# Patient Record
Sex: Female | Born: 1965 | Race: White | Hispanic: No | Marital: Married | Smoking: Former smoker
Health system: Southern US, Community
[De-identification: ages and names within clinical notes are randomized; demographics above are authoritative.]

## PROBLEM LIST (undated history)

## (undated) DIAGNOSIS — I10 Essential (primary) hypertension: Secondary | ICD-10-CM

## (undated) DIAGNOSIS — F32A Depression, unspecified: Secondary | ICD-10-CM

## (undated) DIAGNOSIS — E7800 Pure hypercholesterolemia, unspecified: Secondary | ICD-10-CM

## (undated) HISTORY — DX: Pure hypercholesterolemia, unspecified: E78.00

## (undated) HISTORY — DX: Essential (primary) hypertension: I10

## (undated) HISTORY — DX: Depression, unspecified: F32.A

---

## 2001-09-27 ENCOUNTER — Ambulatory Visit (HOSPITAL_COMMUNITY): Admission: RE | Admit: 2001-09-27 | Discharge: 2001-09-27 | Payer: Self-pay | Admitting: Obstetrics & Gynecology

## 2002-05-17 ENCOUNTER — Observation Stay (HOSPITAL_COMMUNITY): Admission: AD | Admit: 2002-05-17 | Discharge: 2002-05-19 | Payer: Self-pay | Admitting: Obstetrics & Gynecology

## 2002-05-18 ENCOUNTER — Encounter: Payer: Self-pay | Admitting: Obstetrics and Gynecology

## 2002-05-18 ENCOUNTER — Encounter: Payer: Self-pay | Admitting: Obstetrics & Gynecology

## 2002-07-25 ENCOUNTER — Inpatient Hospital Stay (HOSPITAL_COMMUNITY): Admission: RE | Admit: 2002-07-25 | Discharge: 2002-07-27 | Payer: Self-pay | Admitting: Obstetrics & Gynecology

## 2002-10-04 ENCOUNTER — Ambulatory Visit (HOSPITAL_COMMUNITY): Admission: RE | Admit: 2002-10-04 | Discharge: 2002-10-04 | Payer: Self-pay | Admitting: Obstetrics & Gynecology

## 2002-10-04 ENCOUNTER — Encounter: Payer: Self-pay | Admitting: Obstetrics & Gynecology

## 2004-09-19 ENCOUNTER — Ambulatory Visit (HOSPITAL_COMMUNITY): Admission: RE | Admit: 2004-09-19 | Discharge: 2004-09-19 | Payer: Self-pay | Admitting: Obstetrics & Gynecology

## 2007-01-04 ENCOUNTER — Encounter: Admission: RE | Admit: 2007-01-04 | Discharge: 2007-01-04 | Payer: Self-pay | Admitting: Obstetrics & Gynecology

## 2010-02-02 ENCOUNTER — Encounter: Payer: Self-pay | Admitting: Obstetrics & Gynecology

## 2010-05-30 NOTE — H&P (Signed)
   NAMEMARYLYNNE, Frances Robinson                            ACCOUNT NO.:  [id number]   MEDICAL RECORD NO.:  [id number]                   PATIENT TYPE:  OBV   LOCATION:  A426                                 FACILITY:  [facility]   PHYSICIAN:  Duane Lope, M.D.                   DATE OF BIRTH:  Oct 30, 1965   DATE OF ADMISSION:  05/17/2002  DATE OF DISCHARGE:                                HISTORY & PHYSICAL   REASON FOR ADMISSION:  Pregnancy at 29 weeks and 1 day with possible  preeclampsia.   HISTORY OF PRESENT ILLNESS:  Frances Robinson presented today for her normal OB  checkup.  At that time Frances Robinson had elevation in blood pressure and proteinuria  and 3+ DTRs.   PAST MEDICAL HISTORY:  Negative.   PAST SURGICAL HISTORY:  1. Positive for a D&C in 1980.  2. Laparoscopic in 2003.  3. Foot surgery in 1990.   ALLERGIES:  No known drug allergies.   SOCIAL HISTORY:  Frances Robinson is married.  Frances Robinson and her husband have a stable  relationship.  He is supportive.   FAMILY HISTORY:  Positive for hypertension, coronary artery disease, and  cancer.   PRENATAL COURSE:  Essentially benign and uneventful up until this point.  Blood type is A positive.  UDS negative.  Rubella is immune.  Hepatitis B  surface antigen negative.  HIV negative.  Serology is nonreactive.  GC and  Chlamydia negative.  Frances Robinson declined her MSAFP.   PHYSICAL EXAMINATION:  VITAL SIGNS:  Weight 238-1/2.  Blood pressure 140/90.  GENERAL:  Frances Robinson has 1+ glucose, 2+ protein, positive for blood __________.  Frances Robinson does have a trace edema.  Frances Robinson also has some facial edema.  Good fetal  movement.  Fundal height is 32 cm.  Fetal heart rate 140, strong and  regular.  DTRs are 3+.   PLAN:  We are going to admit, get her preeclamptic labs, do a 24-hour urine  and monitor her vital signs and fetal status.     Zerita Boers, N.M.                      Duane Lope, M.D.   DL/MEDQ  D:  40/98/1191  T:  05/17/2002  Job:  [id number]   cc:   [facility]

## 2010-05-30 NOTE — Discharge Summary (Signed)
   NAMEJEENA, Frances Robinson                            ACCOUNT NO.:  [id number]   MEDICAL RECORD NO.:  [id number]                   PATIENT TYPE:  INP   LOCATION:  A420                                 FACILITY:  [facility]   PHYSICIAN:  Lazaro Arms, M.D.                DATE OF BIRTH:  Jun 07, 1965   DATE OF ADMISSION:  07/25/2002  DATE OF DISCHARGE:  07/27/2002                                 DISCHARGE SUMMARY   DISCHARGE DIAGNOSES:  1. Status post primary low transverse cesarean section.  2. Unremarkable postoperative course.   PROCEDURE:  Primary low transverse cesarean section.   HISTORY OF PRESENT ILLNESS:  Please refer to transcribed history and  physical and operative note for details of admission to the hospital.   HOSPITAL COURSE:  The patient had an unremarkable postoperative course.  She  tolerated clear liquids and a regular diet well without symptoms, was  ambulatory.  Had a normal hemoglobin and hematocrit postoperatively with an  appropriate drop from 11.6 to 10.5.  Her incision was clean, dry, and  intact, and she tolerated oral pain medicine.  She was discharged to home on  postoperative day #2 in good and stable.  Follow up in the office next week  to have her staples removed.  She was given instructions and precautions for  return prior to that time.                                               Lazaro Arms, M.D.    [name]  D:  08/08/2002  T:  08/08/2002  Job:  [id number]

## 2010-05-30 NOTE — Op Note (Signed)
[name], Frances Robinson                            ACCOUNT NO.:  [id number]   MEDICAL RECORD NO.:  [id number]                   PATIENT TYPE:  AMB   LOCATION:  DAY                                  FACILITY:  [facility]   PHYSICIAN:  Lazaro Arms, M.D.                DATE OF BIRTH:  1965-02-21   DATE OF PROCEDURE:  09/27/2001  DATE OF DISCHARGE:                                 OPERATIVE REPORT   PREOPERATIVE DIAGNOSES:  1. Dysmenorrhea.  2. Dyspareunia.   POSTOPERATIVE DIAGNOSES:  1. Dysmenorrhea.  2. Dyspareunia.   PROCEDURE:  Diagnostic laparoscopy with chromotubation.   SURGEON:  Lazaro Arms, M.D.   ANESTHESIA:  General endotracheal.   FINDINGS:  The patient had two small pinhead dots of endometriosis in the  posterior cul-de-sac.  These were both ablated without difficulty.  The  ovaries, tubes, uterus, peritoneal surfaces, uterosacral ligaments all  appeared to be normal.  There was no other endometriosis seen, no adhesive  disease.  The tubal fimbria were supple and normal.  A 1:10 dilution of  methylene blue was injected and both tubes filled and spilled.  The right  tube was a little sluggish but I finally kinked off the left enough to allow  it to spill as well.   DESCRIPTION OF OPERATION:  The patient was taken to the operating room,  placed in the supine possible, underwent general endotracheal anesthesia,  placed in the dorsal lithotomy position.  The vagina was prepped.  The Foley  catheter was placed.  The abdomen was prepped and draped.  A Pelosi uterine  manipulator was placed into the uterine cavity and used as well for  chromotubation.  An incision was made in the umbilicus and carried down to  the rectus fascia digitally.  The pistol-grip trocar was then placed into  the peritoneal cavity.  I was actually between the fascia and peritoneum and  entered the peritoneum manually.  I then insufflated the peritoneal cavity  and this was confirmed with the video  laparoscope and done under direct and  continuous visualization through the fascia.  Two fingerbreadths above the  pubis a 5-mm trocar was then placed under direct visualization.  The above-  noted findings were seen.  A total of 60 cc of 10% methylene blue solution  was injected and both tubes filled and spilled easily.  The endometriosis  was burned with the cautery.  The methylene blue was then suctioned out and  diluted further with some saline.  There was no bleeding surfaces.  The  trocars were removed, the gas was allowed to escape.  The fascial defect of  the umbilical incision was closed with a single 0 Vicryl suture.  Both  incisions were closed with 3-0  Vicryl subcuticular and Dermabond was placed for dressing and wound  protection.  The patient tolerated the procedure well.  She was awakened  from anesthesia, taken to the recovery room in good stable condition.  All  counts correct.                                               Lazaro Arms, M.D.    [name]  D:  09/27/2001  T:  09/28/2001  Job:  [id number]

## 2010-05-30 NOTE — Op Note (Signed)
Frances Robinson, Frances Robinson                            ACCOUNT NO.:  [id number]   MEDICAL RECORD NO.:  [id number]                   PATIENT TYPE:  INP   LOCATION:  A420                                 FACILITY:  [facility]   PHYSICIAN:  Lazaro Arms, M.D.                DATE OF BIRTH:  1965-06-22   DATE OF PROCEDURE:  07/25/2002  DATE OF DISCHARGE:                                 OPERATIVE REPORT   PREOPERATIVE DIAGNOSES:  1. Intrauterine pregnancy [redacted] weeks gestation.  2. Inadequate pelvis.  3. Scheduled for C-section July 28, 2002.  4. Spontaneous rupture of membranes.  5. Desires sterilization.   POSTOPERATIVE DIAGNOSES:  1. Intrauterine pregnancy [redacted] weeks gestation.  2. Inadequate pelvis.  3. Scheduled for C-section July 28, 2002.  4. Spontaneous rupture of membranes.  5. Desires sterilization.   PROCEDURE:  1. Primary low transverse cesarean section with bilateral tubal ligation.   SURGEON:  Lazaro Arms, M.D.   ANESTHESIA:  Spinal.   FINDINGS:  Over a low transverse hysterotomy incision delivered a viable  female infant with Apgars of 9 and 9 with weight to be determined in the  nursery.  There was a  3-vessel cord.  The placenta was normal.  The uterus  tubes and ovaries are normal.   DESCRIPTION OF PROCEDURE:  The patient was taken to the operating room and  underwent a spinal anesthetic.  She was placed in the supine position with a  roll under her right hip.  She was prepped and draped in the usual sterile  fashion.  A Foley catheter was placed.  A Pfannenstiel skin incision was  made and carried down sharply through the rectus fascia which was scored in  the midline and extended laterally.  The fascia was taken off the muscles  superiorly and inferiorly without difficulty.  The muscles were divided.  The peritoneal cavity was entered.  Bladder blade was placed.   A vesicouterine serosal flap was created.  A low transverse hysterotomy  incision made and over this  incision was delivered a viable female infant.  Apgars were 9 and 9 with weight to be determined in the nursery.  The infant  underwent routine neonatal resuscitation.  There was a 3 vessel cord.  Cord  blood and cord gas were sent.  The placenta was delivered spontaneously.  The uterus was exteriorized and closed in two layers.  The first being a  running interlocking layer and the second being an imbricating layer.   Modified Pomeroy bilateral tubal ligation was performed bilaterally and  tubal segments send to the lab.  There was good hemostasis.   The uterus was hemostatic.  The uterus was replaced in the peritoneal  cavity.  The pericolic gutters were irrigated.  All pedicles were  hemostatic.  The peritoneum and muscles reapproximated loosely.  The fascia  was closed using #0 Vicryl running.  Subcutaneous tissues made hemostatic  and irrigated.  The skin was closed using skin staples.  The patient  tolerated the procedure well.  She experienced 500 cc of blood loss and was  taken to the recovery room in good stable condition.  All counts were  correct x3.                                               Lazaro Arms, M.D.    [name]  D:  07/25/2002  T:  07/25/2002  Job:  [id number]

## 2010-05-30 NOTE — H&P (Signed)
Frances Robinson, Frances Robinson                            ACCOUNT NO.:  [id number]   MEDICAL RECORD NO.:  [id number]                   PATIENT TYPE:  INP   LOCATION:  A420                                 FACILITY:  [facility]   PHYSICIAN:  Lazaro Arms, M.D.                DATE OF BIRTH:  12-24-1965   DATE OF ADMISSION:  07/25/2002  DATE OF DISCHARGE:                                HISTORY & PHYSICAL   HISTORY OF PRESENT ILLNESS:  Frances Robinson is a 45 year old white female, gravida  2, para 0, abortus 1, estimated date of delivery of August 01, 2002, currently  at 82 weeks' gestation, who presented to labor and delivery complaining of  spontaneous rupture of membranes.  It was obvious spontaneous rupture of  membranes by the nursing evaluation.  She was scheduled for a C-section on  July 16 because of a very narrow pubic arch and a high presenting part.  She  also desires sterilization.  As a result, we will proceed with a cesarean  section today.   PAST MEDICAL HISTORY:  Negative.   PAST SURGICAL HISTORY:  She had a laparoscopy in October 2003 and promptly  got pregnant that next month.  In 1990 she had foot surgery.   PAST OBSTETRICAL HISTORY:  She had an elective termination in 1980.   ALLERGIES:  None.   MEDICATIONS:  Prenatal vitamins.   SOCIAL HISTORY:  She is married and works full-time.   FAMILY HISTORY:  Significant for hypertension and cardiovascular disease.   PRENATAL LABORATORY DATA:  A positive.  Rubella is immune.  Hepatitis B was  negative.  HIV was nonreactive.  Serology was nonreactive.  Pap was normal.  GC and Chlamydia were negative.  She declined her AFP.  Her group B strep  was negative.  Her Glucola was 138.   PHYSICAL EXAMINATION:  HEENT:  Unremarkable.  NECK:  Thyroid is normal.  CHEST:  Lungs clear.  CARDIAC:  Regular rate and rhythm without murmur, rub, or gallop.  BREASTS:  Without masses, discharge, or skin changes.  ABDOMEN:  Gravid, fundal height of 40 cm.  PELVIC:  Long, thick, and closed.  EXTREMITIES:  Warm with 1+ edema.  NEUROLOGIC:  Grossly intact.   IMPRESSION:  1. Intrauterine pregnancy at 62 weeks' gestation.  2. Inadequate pelvis.  3. Spontaneous rupture of membranes.   PLAN:  The patient is admitted for a cesarean section and tubal ligation.  She does desire permanent sterilization.  We will proceed with that this  morning.                                               Lazaro Arms, M.D.    Frances Robinson  D:  07/25/2002  T:  07/25/2002  Job:  [id number]  

## 2013-01-20 ENCOUNTER — Other Ambulatory Visit: Payer: Self-pay | Admitting: Obstetrics & Gynecology

## 2013-01-20 DIAGNOSIS — Z139 Encounter for screening, unspecified: Secondary | ICD-10-CM

## 2013-01-30 ENCOUNTER — Ambulatory Visit (HOSPITAL_COMMUNITY)
Admission: RE | Admit: 2013-01-30 | Discharge: 2013-01-30 | Disposition: A | Discharge status: Home / Self Care | Payer: BC Managed Care – PPO | Source: Ambulatory Visit | Attending: Obstetrics & Gynecology | Admitting: Obstetrics & Gynecology

## 2013-01-30 DIAGNOSIS — Z1231 Encounter for screening mammogram for malignant neoplasm of breast: Secondary | ICD-10-CM | POA: Insufficient documentation

## 2013-01-30 DIAGNOSIS — Z139 Encounter for screening, unspecified: Secondary | ICD-10-CM

## 2013-02-01 ENCOUNTER — Other Ambulatory Visit: Payer: Self-pay | Admitting: Women's Health

## 2015-02-05 ENCOUNTER — Other Ambulatory Visit (HOSPITAL_COMMUNITY)
Admission: RE | Admit: 2015-02-05 | Discharge: 2015-02-05 | Disposition: A | Discharge status: Home / Self Care | Payer: BLUE CROSS/BLUE SHIELD | Source: Ambulatory Visit | Attending: Obstetrics & Gynecology | Admitting: Obstetrics & Gynecology

## 2015-02-05 ENCOUNTER — Ambulatory Visit (INDEPENDENT_AMBULATORY_CARE_PROVIDER_SITE_OTHER): Payer: BLUE CROSS/BLUE SHIELD | Admitting: Obstetrics & Gynecology

## 2015-02-05 ENCOUNTER — Encounter: Payer: Self-pay | Admitting: Obstetrics & Gynecology

## 2015-02-05 VITALS — BP 128/70 | HR 80 | Ht 66.0 in | Wt 249.4 lb

## 2015-02-05 DIAGNOSIS — D500 Iron deficiency anemia secondary to blood loss (chronic): Secondary | ICD-10-CM | POA: Diagnosis not present

## 2015-02-05 DIAGNOSIS — Z01419 Encounter for gynecological examination (general) (routine) without abnormal findings: Secondary | ICD-10-CM

## 2015-02-05 DIAGNOSIS — Z1151 Encounter for screening for human papillomavirus (HPV): Secondary | ICD-10-CM | POA: Insufficient documentation

## 2015-02-05 DIAGNOSIS — N921 Excessive and frequent menstruation with irregular cycle: Secondary | ICD-10-CM

## 2015-02-05 LAB — POCT HEMOGLOBIN: HEMOGLOBIN: 8.2 g/dL — AB (ref 12.2–16.2)

## 2015-02-05 MED ORDER — MEGESTROL ACETATE 40 MG PO TABS: ORAL_TABLET | ORAL | Status: DC

## 2015-02-05 NOTE — Progress Notes (Signed)
Patient ID: Frances Robinson, female   DOB: 01/06/1966, 50 y.o.   MRN: [id number] Subjective:     Frances Robinson is a 50 y.o. female here for a routine exam.  Patient's last menstrual period was 01/15/2014. No obstetric history on file. Birth Control Method:  BTL Menstrual Calendar(currently): heavy painful lasting longer  Current complaints: menses.   Current acute medical issues:     Recent Gynecologic History Patient's last menstrual period was 01/15/2014. Last Pap: 2004?,  normal Last mammogram: 2015,  normal  History reviewed. No pertinent past medical history.  History reviewed. No pertinent past surgical history.  OB History    No data available      Social History   Social History  . Marital Status: Married    Spouse Name: N/A  . Number of Children: N/A  . Years of Education: N/A   Social History Main Topics  . Smoking status: Former Research scientist (life sciences)  . Smokeless tobacco: None  . Alcohol Use: None  . Drug Use: None  . Sexual Activity: Not Asked   Other Topics Concern  . None   Social History Narrative  . None    Family History  Problem Relation Age of Onset  . Diabetes Mother   . Diabetes Father      Current outpatient prescriptions:  .  citalopram (CELEXA) 20 MG tablet, Take 20 mg by mouth daily., Disp: , Rfl:  .  famotidine (PEPCID) 20 MG tablet, Take 20 mg by mouth 2 (two) times daily., Disp: , Rfl:  .  lisinopril (PRINIVIL,ZESTRIL) 5 MG tablet, Take 5 mg by mouth daily., Disp: , Rfl:  .  Melaton-Thean-Cham-PassF-LBalm (MELATONIN + L-THEANINE PO), Take by mouth., Disp: , Rfl:  .  Propylhexedrine (BENZEDREX NA), Place into the nose., Disp: , Rfl:  .  megestrol (MEGACE) 40 MG tablet, 3 tablets a day for 5 days, 2 tablets a day for 5 days then 1 tablet daily, Disp: 45 tablet, Rfl: 3  Review of Systems  Review of Systems  Constitutional: Negative for fever, chills, weight loss, malaise/fatigue and diaphoresis.  HENT: Negative for hearing loss, ear pain,  nosebleeds, congestion, sore throat, neck pain, tinnitus and ear discharge.   Eyes: Negative for blurred vision, double vision, photophobia, pain, discharge and redness.  Respiratory: Negative for cough, hemoptysis, sputum production, shortness of breath, wheezing and stridor.   Cardiovascular: Negative for chest pain, palpitations, orthopnea, claudication, leg swelling and PND.  Gastrointestinal: negative for abdominal pain. Negative for heartburn, nausea, vomiting, diarrhea, constipation, blood in stool and melena.  Genitourinary: Negative for dysuria, urgency, frequency, hematuria and flank pain.  Musculoskeletal: Negative for myalgias, back pain, joint pain and falls.  Skin: Negative for itching and rash.  Neurological: Negative for dizziness, tingling, tremors, sensory change, speech change, focal weakness, seizures, loss of consciousness, weakness and headaches.  Endo/Heme/Allergies: Negative for environmental allergies and polydipsia. Does not bruise/bleed easily.  Psychiatric/Behavioral: Negative for depression, suicidal ideas, hallucinations, memory loss and substance abuse. The patient is not nervous/anxious and does not have insomnia.        Objective:  Blood pressure 128/70, pulse 80, height 5\' 6"  (1.676 m), weight 249 lb 6.4 oz (113.127 kg), last menstrual period 01/15/2014.   Physical Exam  Vitals reviewed. Constitutional: She is oriented to person, place, and time. She appears well-developed and well-nourished.  HENT:  Head: Normocephalic and atraumatic.        Right Ear: External ear normal.  Left Ear: External ear normal.  Nose: Nose normal.  Mouth/Throat: Oropharynx is clear and moist.  Eyes: Conjunctivae and EOM are normal. Pupils are equal, round, and reactive to light. Right eye exhibits no discharge. Left eye exhibits no discharge. No scleral icterus.  Neck: Normal range of motion. Neck supple. No tracheal deviation present. No thyromegaly present.  Cardiovascular:  Normal rate, regular rhythm, normal heart sounds and intact distal pulses.  Exam reveals no gallop and no friction rub.   No murmur heard. Respiratory: Effort normal and breath sounds normal. No respiratory distress. She has no wheezes. She has no rales. She exhibits no tenderness.  GI: Soft. Bowel sounds are normal. She exhibits no distension and no mass. There is no tenderness. There is no rebound and no guarding.  Genitourinary:  Breasts no masses skin changes or nipple changes bilaterally      Vulva is normal without lesions Vagina is pink moist without discharge Cervix normal in appearance and pap is done Uterus is normal size shape and contour Adnexa is negative with normal sized ovaries   Musculoskeletal: Normal range of motion. She exhibits no edema and no tenderness.  Neurological: She is alert and oriented to person, place, and time. She has normal reflexes. She displays normal reflexes. No cranial nerve deficit. She exhibits normal muscle tone. Coordination normal.  Skin: Skin is warm and dry. No rash noted. No erythema. No pallor.  Psychiatric: She has a normal mood and affect. Her behavior is normal. Judgment and thought content normal.       Assessment:    Healthy female exam.    Plan:     Return in about 1 month (around 03/08/2015) for GYN sono, Follow up, with Dr Elonda Husky.   Meds ordered this encounter  Medications  . citalopram (CELEXA) 20 MG tablet    Sig: Take 20 mg by mouth daily.  Marland Kitchen lisinopril (PRINIVIL,ZESTRIL) 5 MG tablet    Sig: Take 5 mg by mouth daily.  . famotidine (PEPCID) 20 MG tablet    Sig: Take 20 mg by mouth 2 (two) times daily.  . Melaton-Thean-Cham-PassF-LBalm (MELATONIN + L-THEANINE PO)    Sig: Take by mouth.  . Propylhexedrine (BENZEDREX NA)    Sig: Place into the nose.  . megestrol (MEGACE) 40 MG tablet    Sig: 3 tablets a day for 5 days, 2 tablets a day for 5 days then 1 tablet daily    Dispense:  45 tablet    Refill:  3   Begin megestrol  for cycle management

## 2015-02-08 LAB — CYTOLOGY - PAP

## 2015-03-06 ENCOUNTER — Ambulatory Visit: Payer: BLUE CROSS/BLUE SHIELD | Admitting: Obstetrics & Gynecology

## 2015-03-07 ENCOUNTER — Ambulatory Visit: Payer: BLUE CROSS/BLUE SHIELD | Admitting: Obstetrics & Gynecology

## 2015-03-12 ENCOUNTER — Other Ambulatory Visit (INDEPENDENT_AMBULATORY_CARE_PROVIDER_SITE_OTHER): Payer: BLUE CROSS/BLUE SHIELD

## 2015-03-12 ENCOUNTER — Encounter: Payer: Self-pay | Admitting: Obstetrics & Gynecology

## 2015-03-12 ENCOUNTER — Ambulatory Visit (INDEPENDENT_AMBULATORY_CARE_PROVIDER_SITE_OTHER): Payer: BLUE CROSS/BLUE SHIELD | Admitting: Obstetrics & Gynecology

## 2015-03-12 VITALS — BP 110/70 | HR 76 | Wt 242.0 lb

## 2015-03-12 DIAGNOSIS — D259 Leiomyoma of uterus, unspecified: Secondary | ICD-10-CM

## 2015-03-12 DIAGNOSIS — N83202 Unspecified ovarian cyst, left side: Secondary | ICD-10-CM | POA: Diagnosis not present

## 2015-03-12 DIAGNOSIS — N946 Dysmenorrhea, unspecified: Secondary | ICD-10-CM | POA: Diagnosis not present

## 2015-03-12 DIAGNOSIS — D500 Iron deficiency anemia secondary to blood loss (chronic): Secondary | ICD-10-CM | POA: Diagnosis not present

## 2015-03-12 DIAGNOSIS — N921 Excessive and frequent menstruation with irregular cycle: Secondary | ICD-10-CM

## 2015-03-12 DIAGNOSIS — N83201 Unspecified ovarian cyst, right side: Secondary | ICD-10-CM | POA: Diagnosis not present

## 2015-03-12 MED ORDER — KETOROLAC TROMETHAMINE 10 MG PO TABS: 10.0000 mg | ORAL_TABLET | Freq: Three times a day (TID) | ORAL | Status: DC | PRN

## 2015-03-12 MED ORDER — NORETHINDRONE 0.35 MG PO TABS: 1.0000 | ORAL_TABLET | Freq: Every day | ORAL | Status: DC

## 2015-03-12 MED ORDER — HYDROCODONE-ACETAMINOPHEN 5-325 MG PO TABS: 1.0000 | ORAL_TABLET | Freq: Four times a day (QID) | ORAL | Status: DC | PRN

## 2015-03-12 NOTE — Progress Notes (Signed)
Patient ID: Frances Robinson, female   DOB: 04-16-1965, 50 y.o.   MRN: [id number] Follow up appointment for results  Chief Complaint  Patient presents with  . gyn visit    cramping/ bleeding/ ultrasound today    Blood pressure 110/70, pulse 76, weight 242 lb (109.77 kg).  US Transvaginal Non-ob  03/12/2015  GYNECOLOGIC SONOGRAM MARKAY DORSCHNER is a 50 y.o. LMP 04/04/2015 for a pelvic sonogram for abnormal uterine bleeding. Uterus                      13.2 x 9.24 x 8.9 cm, anteverted uterus w/ a posterior 7 x 5.4 x 6.4 cm subserosal fibroid Endometrium          14.3 symmetrical, wnl Right ovary             3.9 x 3.1 x 2.5 cm, normal rt ov w/ a simple dominate follicle 2.2 x 1.7 x [id number] Left ovary                4.6 x 3.2 x 5.2 cm, ,simple cyst lt ov 3.8 x 3.7 x 3.1cm No free fluid seen Technician Comments: PELVIC US TA/TV: anteverted uterus w/ a posterior 7 x 5.4 x 6.4 cm subserosal fibroid,EEC 14.3cm,simple cyst lt ov 3.8 x 3.7 x 3.1cm,normal rt ov w/ a simple dominate follicle 2.2 x 1.7 x [id number] free fluid seen,lt adnexal pain during ultrasound. Silver Huguenin 03/12/2015 4:18 PM   US Pelvis Complete  03/12/2015  GYNECOLOGIC SONOGRAM Frances Robinson is a 50 y.o. LMP 04/04/2015 for a pelvic sonogram for abnormal uterine bleeding. Uterus                      13.2 x 9.24 x 8.9 cm, anteverted uterus w/ a posterior 7 x 5.4 x 6.4 cm subserosal fibroid Endometrium          14.3 symmetrical, wnl Right ovary             3.9 x 3.1 x 2.5 cm, normal rt ov w/ a simple dominate follicle 2.2 x 1.7 x [id number] Left ovary                4.6 x 3.2 x 5.2 cm, ,simple cyst lt ov 3.8 x 3.7 x 3.1cm No free fluid seen Technician Comments: PELVIC US TA/TV: anteverted uterus w/ a posterior 7 x 5.4 x 6.4 cm subserosal fibroid,EEC 14.3cm,simple cyst lt ov 3.8 x 3.7 x 3.1cm,normal rt ov w/ a simple dominate follicle 2.2 x 1.7 x [id number] free fluid seen,lt adnexal pain during ultrasound. Frances Robinson 03/12/2015 4:18 PM    Normal sonogram  MEDS  ordered this encounter: Meds ordered this encounter  Medications  . ketorolac (TORADOL) 10 MG tablet    Sig: Take 1 tablet (10 mg total) by mouth every 8 (eight) hours as needed.    Dispense:  15 tablet    Refill:  0  . HYDROcodone-acetaminophen (NORCO/VICODIN) 5-325 MG tablet    Sig: Take 1 tablet by mouth every 6 (six) hours as needed.    Dispense:  30 tablet    Refill:  0  . norethindrone (MICRONOR,CAMILA,ERRIN) 0.35 MG tablet    Sig: Take 1 tablet (0.35 mg total) by mouth daily. Take 1 a day    Dispense:  1 Package    Refill:  11    Orders for this encounter: No orders of the defined types were placed in this encounter.  Plan: Will place on megace for 1 month then switch over to micronor in hopes of making it through menopausae without intervention surgically Follow Up: Return in about 6 months (around 09/09/2015) for Follow up, with Dr Elonda Husky.      Face to face time:  10 minutes  Greater than 50% of the visit time was spent in counseling and coordination of care with the patient.  The summary and outline of the counseling and care coordination is summarized in the note above.   All questions were answered.  History reviewed. No pertinent past medical history.  History reviewed. No pertinent past surgical history.  OB History    No data available      Not on File  Social History   Social History  . Marital Status: Married    Spouse Name: N/A  . Number of Children: N/A  . Years of Education: N/A   Social History Main Topics  . Smoking status: Former Research scientist (life sciences)  . Smokeless tobacco: None  . Alcohol Use: None  . Drug Use: None  . Sexual Activity: Not Asked   Other Topics Concern  . None   Social History Narrative    Family History  Problem Relation Age of Onset  . Diabetes Mother   . Diabetes Father

## 2015-03-12 NOTE — Progress Notes (Signed)
PELVIC US TA/TV: anteverted uterus w/ a posterior 7 x 5.4 x 6.4 cm subserosal fibroid,EEC 14.3cm,simple cyst lt ov 3.8 x 3.7 x 3.1cm,normal rt ov w/ a simple dominate follicle 2.2 x 1.7 x [id number] free fluid seen,lt adnexal pain during ultrasound.

## 2015-03-13 IMAGING — MG MM DIGITAL SCREENING
4 series · 4 of 4 positions shown · non-contrast
Comparison: Previous exam(s).

CLINICAL DATA: Screening.

EXAM:
DIGITAL SCREENING BILATERAL MAMMOGRAM WITH CAD

[L CC]
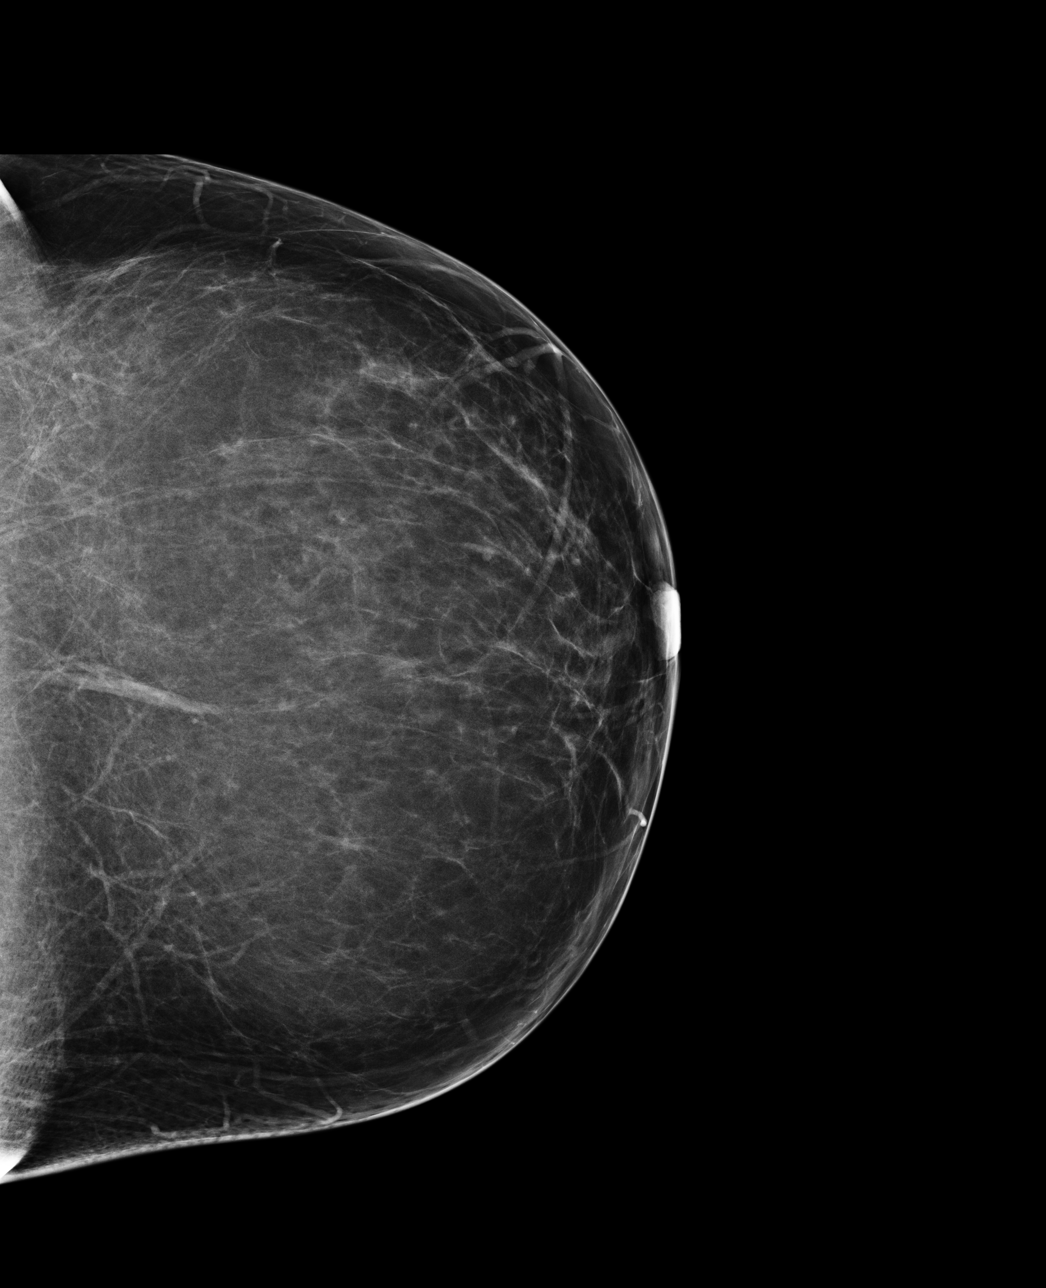

[L MLO]
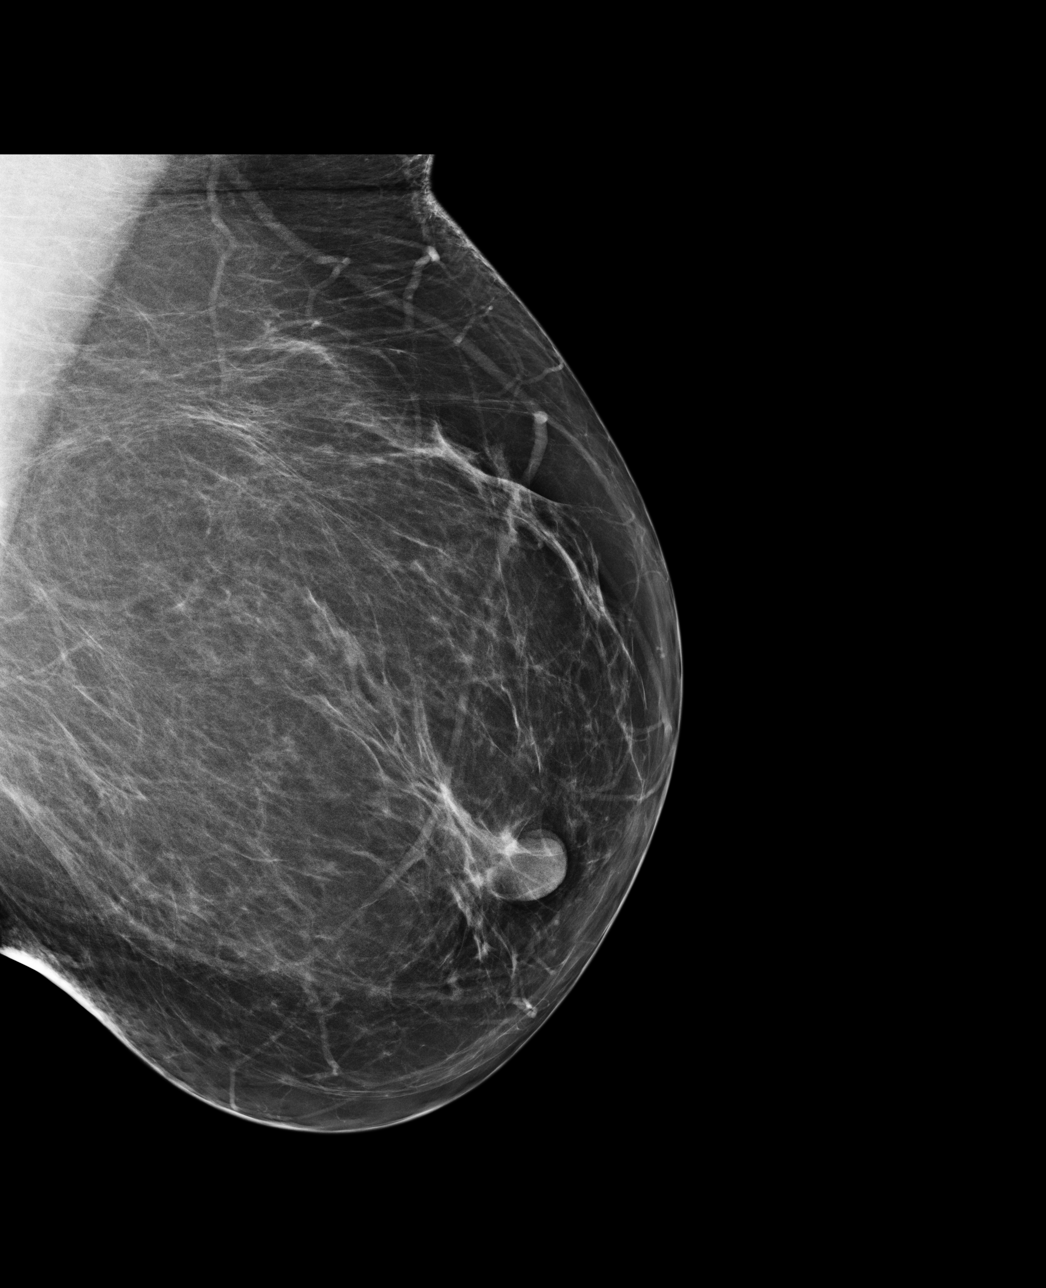

[R CC]
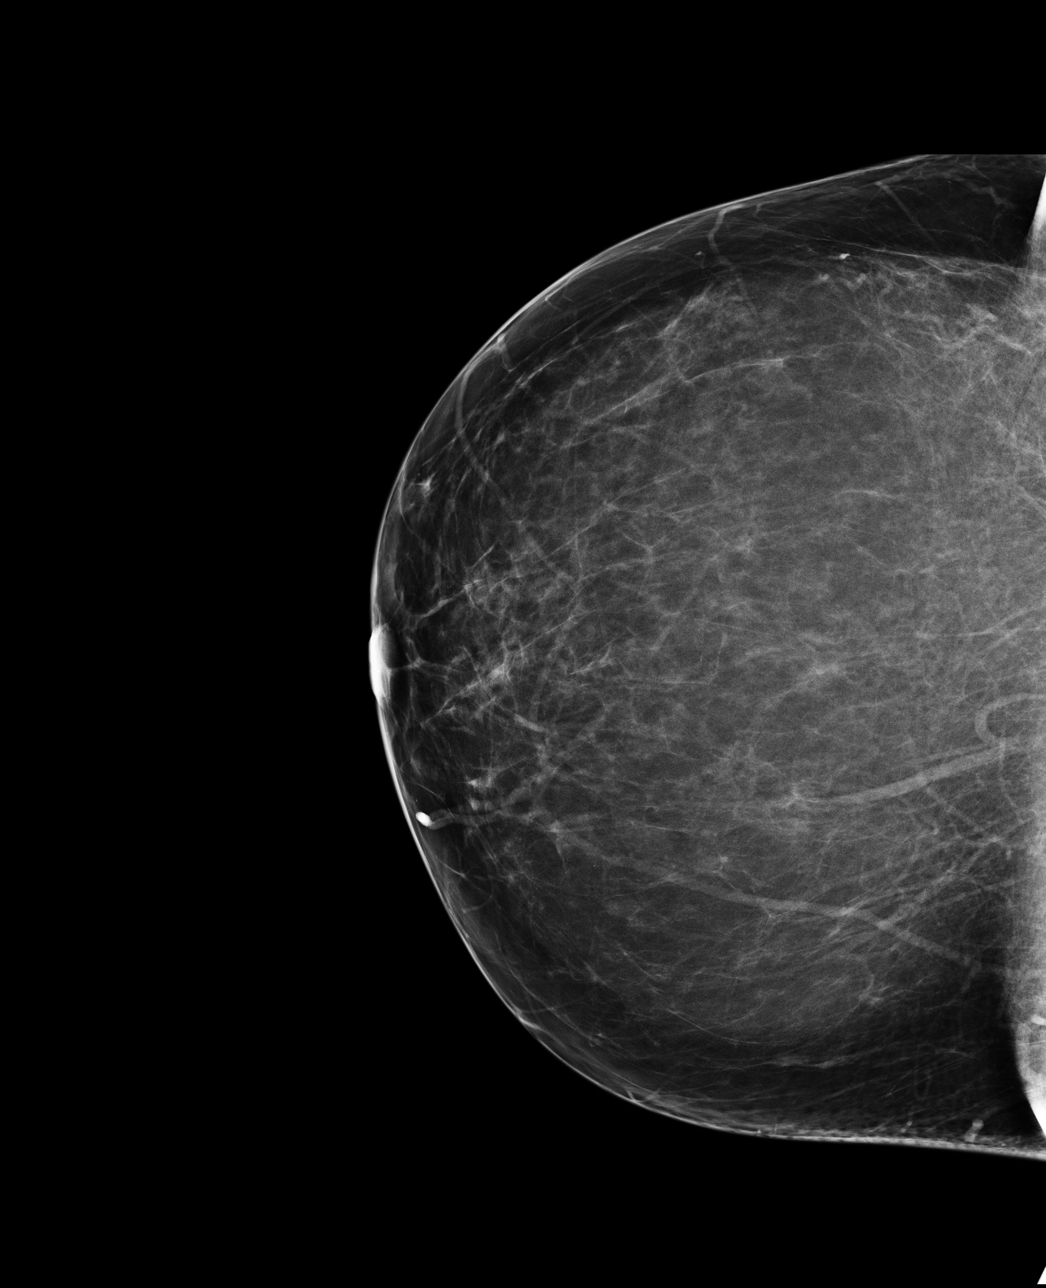

[R MLO]
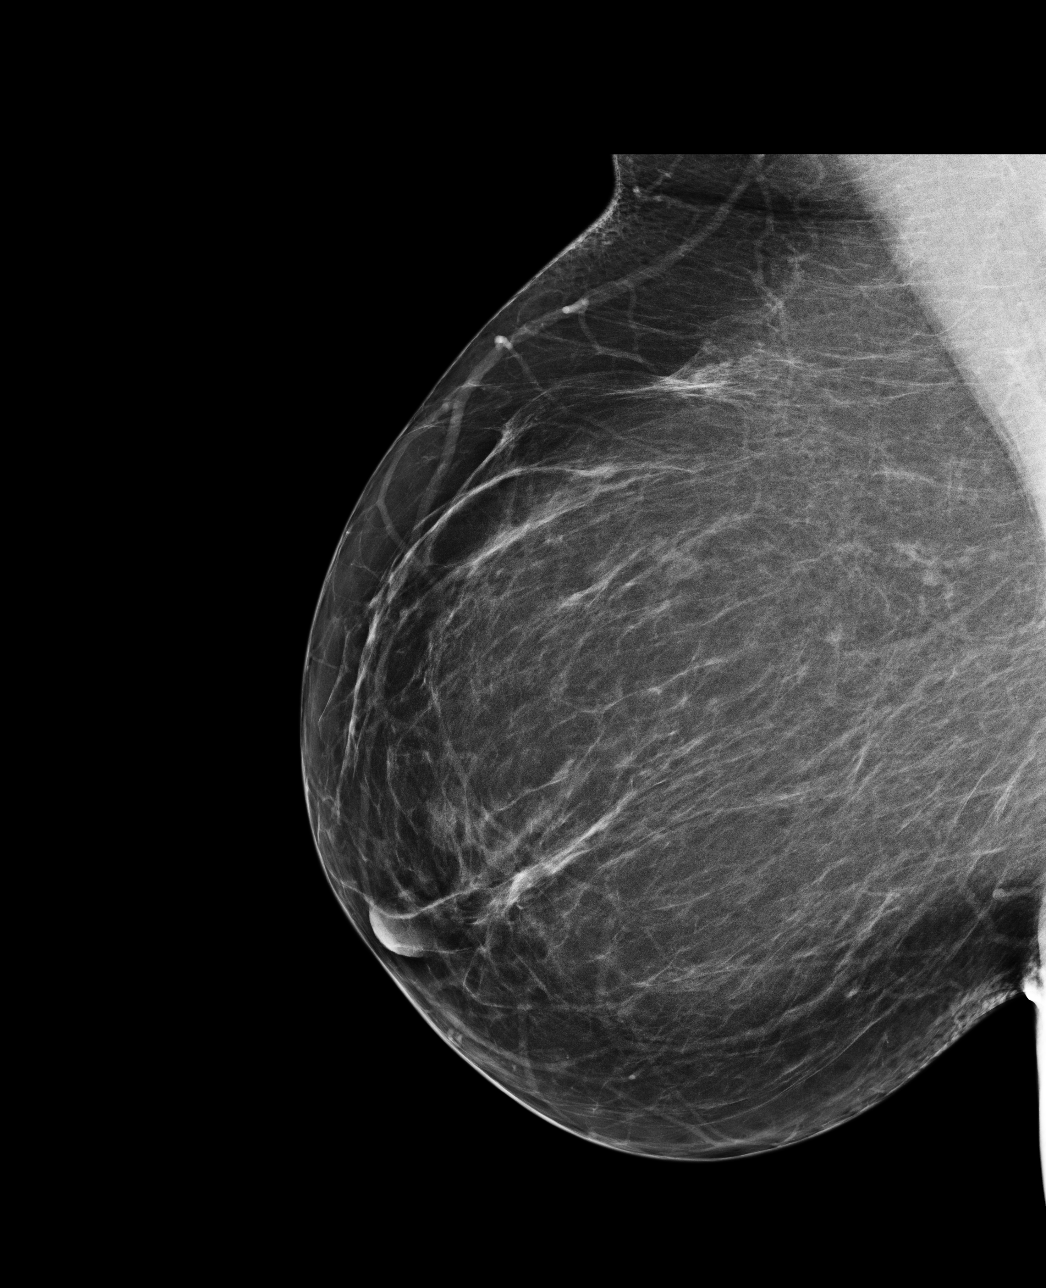

[4 of 4 positions shown; findings below may reference images not displayed]

ACR Breast Density Category b: There are scattered areas of
fibroglandular density.
FINDINGS: There are no findings suspicious for malignancy. Images were
processed with CAD.
IMPRESSION: No mammographic evidence of malignancy. A result letter of this
screening mammogram will be mailed directly to the patient.

RECOMMENDATION:
Screening mammogram in one year. (Code:AS-G-LCT)

BI-RADS CATEGORY  1: Negative.

## 2018-03-29 DIAGNOSIS — I10 Essential (primary) hypertension: Secondary | ICD-10-CM | POA: Diagnosis not present

## 2018-03-29 DIAGNOSIS — K210 Gastro-esophageal reflux disease with esophagitis: Secondary | ICD-10-CM | POA: Diagnosis not present

## 2018-03-29 DIAGNOSIS — F330 Major depressive disorder, recurrent, mild: Secondary | ICD-10-CM | POA: Diagnosis not present

## 2018-03-29 DIAGNOSIS — F419 Anxiety disorder, unspecified: Secondary | ICD-10-CM | POA: Diagnosis not present

## 2018-05-23 DIAGNOSIS — Z1231 Encounter for screening mammogram for malignant neoplasm of breast: Secondary | ICD-10-CM | POA: Diagnosis not present

## 2018-06-28 DIAGNOSIS — F330 Major depressive disorder, recurrent, mild: Secondary | ICD-10-CM | POA: Diagnosis not present

## 2018-06-28 DIAGNOSIS — K210 Gastro-esophageal reflux disease with esophagitis: Secondary | ICD-10-CM | POA: Diagnosis not present

## 2018-06-28 DIAGNOSIS — F419 Anxiety disorder, unspecified: Secondary | ICD-10-CM | POA: Diagnosis not present

## 2018-06-28 DIAGNOSIS — I10 Essential (primary) hypertension: Secondary | ICD-10-CM | POA: Diagnosis not present

## 2018-08-15 ENCOUNTER — Encounter: Payer: Self-pay | Admitting: *Deleted

## 2018-09-29 ENCOUNTER — Ambulatory Visit: Payer: BLUE CROSS/BLUE SHIELD

## 2018-10-05 DIAGNOSIS — Z6841 Body Mass Index (BMI) 40.0 and over, adult: Secondary | ICD-10-CM | POA: Diagnosis not present

## 2018-10-05 DIAGNOSIS — Z0000 Encounter for general adult medical examination without abnormal findings: Secondary | ICD-10-CM | POA: Diagnosis not present

## 2018-12-19 DIAGNOSIS — E785 Hyperlipidemia, unspecified: Secondary | ICD-10-CM | POA: Diagnosis not present

## 2018-12-19 DIAGNOSIS — I10 Essential (primary) hypertension: Secondary | ICD-10-CM | POA: Diagnosis not present

## 2018-12-19 DIAGNOSIS — Z0000 Encounter for general adult medical examination without abnormal findings: Secondary | ICD-10-CM | POA: Diagnosis not present

## 2018-12-19 DIAGNOSIS — E7849 Other hyperlipidemia: Secondary | ICD-10-CM | POA: Diagnosis not present

## 2018-12-19 DIAGNOSIS — G4730 Sleep apnea, unspecified: Secondary | ICD-10-CM | POA: Diagnosis not present

## 2019-01-16 DIAGNOSIS — M5432 Sciatica, left side: Secondary | ICD-10-CM | POA: Diagnosis not present

## 2019-01-16 DIAGNOSIS — Z6841 Body Mass Index (BMI) 40.0 and over, adult: Secondary | ICD-10-CM | POA: Diagnosis not present

## 2019-01-31 DIAGNOSIS — M5432 Sciatica, left side: Secondary | ICD-10-CM | POA: Diagnosis not present

## 2019-01-31 DIAGNOSIS — Z6841 Body Mass Index (BMI) 40.0 and over, adult: Secondary | ICD-10-CM | POA: Diagnosis not present

## 2019-05-02 DIAGNOSIS — M5432 Sciatica, left side: Secondary | ICD-10-CM | POA: Diagnosis not present

## 2019-05-02 DIAGNOSIS — G4730 Sleep apnea, unspecified: Secondary | ICD-10-CM | POA: Diagnosis not present

## 2019-05-02 DIAGNOSIS — E7849 Other hyperlipidemia: Secondary | ICD-10-CM | POA: Diagnosis not present

## 2019-05-02 DIAGNOSIS — I10 Essential (primary) hypertension: Secondary | ICD-10-CM | POA: Diagnosis not present

## 2019-08-21 DIAGNOSIS — I10 Essential (primary) hypertension: Secondary | ICD-10-CM | POA: Diagnosis not present

## 2019-08-21 DIAGNOSIS — E7849 Other hyperlipidemia: Secondary | ICD-10-CM | POA: Diagnosis not present

## 2019-08-21 DIAGNOSIS — M7050 Other bursitis of knee, unspecified knee: Secondary | ICD-10-CM | POA: Diagnosis not present

## 2019-08-21 DIAGNOSIS — G4730 Sleep apnea, unspecified: Secondary | ICD-10-CM | POA: Diagnosis not present

## 2019-08-21 DIAGNOSIS — M5432 Sciatica, left side: Secondary | ICD-10-CM | POA: Diagnosis not present

## 2020-02-08 DIAGNOSIS — Z6841 Body Mass Index (BMI) 40.0 and over, adult: Secondary | ICD-10-CM | POA: Diagnosis not present

## 2020-02-08 DIAGNOSIS — Z0000 Encounter for general adult medical examination without abnormal findings: Secondary | ICD-10-CM | POA: Diagnosis not present

## 2020-02-09 DIAGNOSIS — G4730 Sleep apnea, unspecified: Secondary | ICD-10-CM | POA: Diagnosis not present

## 2020-02-09 DIAGNOSIS — M5432 Sciatica, left side: Secondary | ICD-10-CM | POA: Diagnosis not present

## 2020-02-09 DIAGNOSIS — I10 Essential (primary) hypertension: Secondary | ICD-10-CM | POA: Diagnosis not present

## 2020-02-09 DIAGNOSIS — E7849 Other hyperlipidemia: Secondary | ICD-10-CM | POA: Diagnosis not present

## 2020-05-13 DIAGNOSIS — I10 Essential (primary) hypertension: Secondary | ICD-10-CM | POA: Diagnosis not present

## 2020-05-13 DIAGNOSIS — E7849 Other hyperlipidemia: Secondary | ICD-10-CM | POA: Diagnosis not present

## 2020-05-13 DIAGNOSIS — G4730 Sleep apnea, unspecified: Secondary | ICD-10-CM | POA: Diagnosis not present

## 2020-05-14 ENCOUNTER — Encounter (INDEPENDENT_AMBULATORY_CARE_PROVIDER_SITE_OTHER): Payer: Self-pay | Admitting: *Deleted

## 2020-09-12 DIAGNOSIS — Z1231 Encounter for screening mammogram for malignant neoplasm of breast: Secondary | ICD-10-CM | POA: Diagnosis not present

## 2020-09-17 DIAGNOSIS — G4730 Sleep apnea, unspecified: Secondary | ICD-10-CM | POA: Diagnosis not present

## 2020-09-17 DIAGNOSIS — H6240 Otitis externa in other diseases classified elsewhere, unspecified ear: Secondary | ICD-10-CM | POA: Diagnosis not present

## 2020-09-17 DIAGNOSIS — I10 Essential (primary) hypertension: Secondary | ICD-10-CM | POA: Diagnosis not present

## 2020-09-17 DIAGNOSIS — E7849 Other hyperlipidemia: Secondary | ICD-10-CM | POA: Diagnosis not present

## 2020-12-24 DIAGNOSIS — I10 Essential (primary) hypertension: Secondary | ICD-10-CM | POA: Diagnosis not present

## 2020-12-24 DIAGNOSIS — G4730 Sleep apnea, unspecified: Secondary | ICD-10-CM | POA: Diagnosis not present

## 2020-12-24 DIAGNOSIS — E7849 Other hyperlipidemia: Secondary | ICD-10-CM | POA: Diagnosis not present

## 2021-04-22 DIAGNOSIS — Z6841 Body Mass Index (BMI) 40.0 and over, adult: Secondary | ICD-10-CM | POA: Diagnosis not present

## 2021-04-22 DIAGNOSIS — E7849 Other hyperlipidemia: Secondary | ICD-10-CM | POA: Diagnosis not present

## 2021-04-22 DIAGNOSIS — Z0000 Encounter for general adult medical examination without abnormal findings: Secondary | ICD-10-CM | POA: Diagnosis not present

## 2021-08-04 DIAGNOSIS — E7849 Other hyperlipidemia: Secondary | ICD-10-CM | POA: Diagnosis not present

## 2021-08-04 DIAGNOSIS — I10 Essential (primary) hypertension: Secondary | ICD-10-CM | POA: Diagnosis not present

## 2021-08-04 DIAGNOSIS — G4730 Sleep apnea, unspecified: Secondary | ICD-10-CM | POA: Diagnosis not present

## 2021-09-19 ENCOUNTER — Other Ambulatory Visit (HOSPITAL_COMMUNITY)
Admission: RE | Admit: 2021-09-19 | Discharge: 2021-09-19 | Disposition: A | Discharge status: Home / Self Care | Payer: BC Managed Care – PPO | Source: Ambulatory Visit | Attending: Adult Health | Admitting: Adult Health

## 2021-09-19 ENCOUNTER — Ambulatory Visit (INDEPENDENT_AMBULATORY_CARE_PROVIDER_SITE_OTHER): Payer: BC Managed Care – PPO | Admitting: Adult Health

## 2021-09-19 ENCOUNTER — Encounter: Payer: Self-pay | Admitting: Adult Health

## 2021-09-19 VITALS — BP 128/76 | HR 87 | Ht 65.0 in | Wt 277.5 lb

## 2021-09-19 DIAGNOSIS — Z124 Encounter for screening for malignant neoplasm of cervix: Secondary | ICD-10-CM | POA: Insufficient documentation

## 2021-09-19 DIAGNOSIS — Z131 Encounter for screening for diabetes mellitus: Secondary | ICD-10-CM

## 2021-09-19 DIAGNOSIS — L680 Hirsutism: Secondary | ICD-10-CM | POA: Insufficient documentation

## 2021-09-19 DIAGNOSIS — L83 Acanthosis nigricans: Secondary | ICD-10-CM

## 2021-09-19 DIAGNOSIS — Z780 Asymptomatic menopausal state: Secondary | ICD-10-CM

## 2021-09-19 DIAGNOSIS — R232 Flushing: Secondary | ICD-10-CM | POA: Insufficient documentation

## 2021-09-19 DIAGNOSIS — B369 Superficial mycosis, unspecified: Secondary | ICD-10-CM | POA: Insufficient documentation

## 2021-09-19 DIAGNOSIS — L74511 Primary focal hyperhidrosis, face: Secondary | ICD-10-CM | POA: Diagnosis not present

## 2021-09-19 MED ORDER — NYSTATIN-TRIAMCINOLONE 100000-0.1 UNIT/GM-% EX CREA
1.0000 | TOPICAL_CREAM | Freq: Two times a day (BID) | CUTANEOUS | 1 refills | Status: AC
Start: 2021-09-19 — End: ?

## 2021-09-19 NOTE — Progress Notes (Signed)
Patient ID: Frances Robinson, female   DOB: 16-Jan-1965, 56 y.o.   MRN: [id number] History of Present Illness: Frances Robinson is a 56 year old white female,married, PM in to talk about hormones, and she needs a pap, last one was in 2017. She is having increased hair growth on face and abdomen, hot flashes and sweating more this summer.  PCP is Dr Sherrie Sport.   Current Medications, Allergies, Past Medical History, Past Surgical History, Family History and Social History were reviewed in [employer] record.     Review of Systems: Denies any vaginal bleeding See HPI for positives     Physical Exam:BP 128/76 (BP Location: Left Arm, Patient Position: Sitting, Cuff Size: Large)   Pulse 87   Ht '5\' 5"'$  (1.651 m)   Wt 277 lb 8 oz (125.9 kg)   LMP 01/15/2014   BMI 46.18 kg/m   General:  Well developed, well nourished, no acute distress Skin:  Warm and dry,has hair on chin and sideburns Lungs; Clear to auscultation bilaterally Cardiovascular: Regular rate and rhythm Abdomen: Has increased dark hair over entire abdomen Pelvic:  External genitalia is normal in appearance, has acanthosis nigricans in groin and few skin tags and mild skin fungus on the right.  The vagina is normal in appearance. Urethra has no lesions or masses. The cervix is smooth, pap with HR HPV genotyping performed. Uterus is felt to be normal size, shape, and contour.  No adnexal masses or tenderness noted.Bladder is non tender, no masses felt. Has healing area left buttock,?resolved boil, was treated with septra ds. Extremities/musculoskeletal:  No swelling or varicosities noted, no clubbing or cyanosis Psych:  No mood changes, alert and cooperative,seems happy AA is 0 Fall risk is low    09/19/2021   10:09 AM  Depression screen PHQ 2/9  Decreased Interest 0  Down, Depressed, Hopeless 0  PHQ - 2 Score 0  Altered sleeping 0  Tired, decreased energy 1  Change in appetite 3  Feeling bad or failure about yourself  0   Trouble concentrating 0  Moving slowly or fidgety/restless 0  Suicidal thoughts 0  PHQ-9 Score 4       09/19/2021   10:09 AM  GAD 7 : Generalized Anxiety Score  Nervous, Anxious, on Edge 0  Control/stop worrying 0  Worry too much - different things 0  Trouble relaxing 0  Restless 0  Easily annoyed or irritable 0  Afraid - awful might happen 0  Total GAD 7 Score 0      Upstream - 09/19/21 1004       Pregnancy Intention Screening   Does the patient want to become pregnant in the next year? N/A    Does the patient's partner want to become pregnant in the next year? N/A    Would the patient like to discuss contraceptive options today? N/A      Contraception Wrap Up   Current Method No Method - Other Reason   postmenopausal   End Method No Method - Other Reason   postmenopausal   Contraception Counseling Provided No            Examination chaperoned by Levy Pupa LPN   Impression and Plan: 1. Routine Papanicolaou smear Pap sent Pap in 3 years if normal Physical with PCP, has some labs with PCP - Cytology - PAP( [facility])  2. Hirsutism Will check labs and consider spirolactone  - Testosterone,Free and Total  3. Hot flashes She says she can tolerate  Did discuss pros and cons of HRT   4. Facial sweating  5. Postmenopause  6. Screening for diabetes mellitus - Hemoglobin A1c  7. Acanthosis nigricans - Comprehensive metabolic panel - Hemoglobin A1c  8. Superficial fungus infection of skin Will rx mytrex Meds ordered this encounter  Medications   nystatin-triamcinolone (MYCOLOG II) cream    Sig: Apply 1 Application topically 2 (two) times daily.    Dispense:  30 g    Refill:  1    Order Specific Question:   Supervising Provider    Answer:   Tania Ade H [[id number]]      Follow up in 6 weeks for ROS, but will talk when labs back

## 2021-09-25 LAB — CYTOLOGY - PAP
Adequacy: ABSENT
Comment: NEGATIVE
Diagnosis: NEGATIVE
High risk HPV: NEGATIVE

## 2021-09-26 ENCOUNTER — Encounter: Payer: Self-pay | Admitting: Adult Health

## 2021-09-26 DIAGNOSIS — E119 Type 2 diabetes mellitus without complications: Secondary | ICD-10-CM | POA: Insufficient documentation

## 2021-09-26 LAB — TESTOSTERONE,FREE AND TOTAL
Testosterone, Free: 1.5 pg/mL (ref 0.0–4.2)
Testosterone: 64 ng/dL — ABNORMAL HIGH (ref 4–50)

## 2021-09-26 LAB — COMPREHENSIVE METABOLIC PANEL
ALT: 36 IU/L — ABNORMAL HIGH (ref 0–32)
AST: 25 IU/L (ref 0–40)
Albumin/Globulin Ratio: 1.6 (ref 1.2–2.2)
Albumin: 4.1 g/dL (ref 3.8–4.9)
Alkaline Phosphatase: 81 IU/L (ref 44–121)
BUN/Creatinine Ratio: 11 (ref 9–23)
BUN: 9 mg/dL (ref 6–24)
Bilirubin Total: 0.4 mg/dL (ref 0.0–1.2)
CO2: 25 mmol/L (ref 20–29)
Calcium: 9.8 mg/dL (ref 8.7–10.2)
Chloride: 103 mmol/L (ref 96–106)
Creatinine, Ser: 0.81 mg/dL (ref 0.57–1.00)
Globulin, Total: 2.6 g/dL (ref 1.5–4.5)
Glucose: 94 mg/dL (ref 70–99)
Potassium: 4.5 mmol/L (ref 3.5–5.2)
Sodium: 141 mmol/L (ref 134–144)
Total Protein: 6.7 g/dL (ref 6.0–8.5)
eGFR: 84 mL/min/{1.73_m2} (ref >59)

## 2021-09-26 LAB — HEMOGLOBIN A1C
Est. average glucose Bld gHb Est-mCnc: 143 mg/dL
Hgb A1c MFr Bld: 6.6 % — ABNORMAL HIGH (ref 4.8–5.6)

## 2021-10-01 ENCOUNTER — Telehealth: Payer: Self-pay | Admitting: Adult Health

## 2021-10-01 DIAGNOSIS — E119 Type 2 diabetes mellitus without complications: Secondary | ICD-10-CM

## 2021-10-01 NOTE — Telephone Encounter (Signed)
Pt called about labs, she will follow up with PCP, does not want to try spirolactone at this time for increased hair

## 2021-10-31 ENCOUNTER — Ambulatory Visit: Payer: BC Managed Care – PPO | Admitting: Adult Health

## 2021-11-10 DIAGNOSIS — G4730 Sleep apnea, unspecified: Secondary | ICD-10-CM | POA: Diagnosis not present

## 2021-11-10 DIAGNOSIS — M5431 Sciatica, right side: Secondary | ICD-10-CM | POA: Diagnosis not present

## 2021-11-10 DIAGNOSIS — Z79899 Other long term (current) drug therapy: Secondary | ICD-10-CM | POA: Diagnosis not present

## 2021-11-10 DIAGNOSIS — I10 Essential (primary) hypertension: Secondary | ICD-10-CM | POA: Diagnosis not present

## 2021-11-10 DIAGNOSIS — E7849 Other hyperlipidemia: Secondary | ICD-10-CM | POA: Diagnosis not present

## 2022-02-06 DIAGNOSIS — Z1231 Encounter for screening mammogram for malignant neoplasm of breast: Secondary | ICD-10-CM | POA: Diagnosis not present

## 2022-02-18 DIAGNOSIS — M5431 Sciatica, right side: Secondary | ICD-10-CM | POA: Diagnosis not present

## 2022-02-18 DIAGNOSIS — E7849 Other hyperlipidemia: Secondary | ICD-10-CM | POA: Diagnosis not present

## 2022-02-18 DIAGNOSIS — I10 Essential (primary) hypertension: Secondary | ICD-10-CM | POA: Diagnosis not present

## 2022-02-18 DIAGNOSIS — G4730 Sleep apnea, unspecified: Secondary | ICD-10-CM | POA: Diagnosis not present

## 2022-05-19 DIAGNOSIS — M26602 Left temporomandibular joint disorder, unspecified: Secondary | ICD-10-CM | POA: Diagnosis not present

## 2022-05-19 DIAGNOSIS — M5431 Sciatica, right side: Secondary | ICD-10-CM | POA: Diagnosis not present

## 2022-05-19 DIAGNOSIS — I10 Essential (primary) hypertension: Secondary | ICD-10-CM | POA: Diagnosis not present

## 2022-05-19 DIAGNOSIS — Z0001 Encounter for general adult medical examination with abnormal findings: Secondary | ICD-10-CM | POA: Diagnosis not present

## 2022-06-16 ENCOUNTER — Encounter (INDEPENDENT_AMBULATORY_CARE_PROVIDER_SITE_OTHER): Payer: Self-pay | Admitting: *Deleted

## 2022-08-13 DIAGNOSIS — Z0000 Encounter for general adult medical examination without abnormal findings: Secondary | ICD-10-CM | POA: Diagnosis not present

## 2022-08-13 DIAGNOSIS — M26602 Left temporomandibular joint disorder, unspecified: Secondary | ICD-10-CM | POA: Diagnosis not present

## 2022-08-13 DIAGNOSIS — I10 Essential (primary) hypertension: Secondary | ICD-10-CM | POA: Diagnosis not present

## 2022-08-13 DIAGNOSIS — M5431 Sciatica, right side: Secondary | ICD-10-CM | POA: Diagnosis not present

## 2022-08-13 DIAGNOSIS — E7849 Other hyperlipidemia: Secondary | ICD-10-CM | POA: Diagnosis not present

## 2022-11-19 DIAGNOSIS — I10 Essential (primary) hypertension: Secondary | ICD-10-CM | POA: Diagnosis not present

## 2022-11-19 DIAGNOSIS — M5431 Sciatica, right side: Secondary | ICD-10-CM | POA: Diagnosis not present

## 2022-11-19 DIAGNOSIS — M26602 Left temporomandibular joint disorder, unspecified: Secondary | ICD-10-CM | POA: Diagnosis not present

## 2022-11-19 DIAGNOSIS — E7849 Other hyperlipidemia: Secondary | ICD-10-CM | POA: Diagnosis not present

## 2022-12-17 ENCOUNTER — Encounter (INDEPENDENT_AMBULATORY_CARE_PROVIDER_SITE_OTHER): Payer: Self-pay | Admitting: *Deleted

## 2023-02-18 DIAGNOSIS — E7849 Other hyperlipidemia: Secondary | ICD-10-CM | POA: Diagnosis not present

## 2023-02-18 DIAGNOSIS — I10 Essential (primary) hypertension: Secondary | ICD-10-CM | POA: Diagnosis not present

## 2023-02-18 DIAGNOSIS — M26602 Left temporomandibular joint disorder, unspecified: Secondary | ICD-10-CM | POA: Diagnosis not present

## 2023-02-18 DIAGNOSIS — M5431 Sciatica, right side: Secondary | ICD-10-CM | POA: Diagnosis not present

## 2023-02-25 DIAGNOSIS — K7689 Other specified diseases of liver: Secondary | ICD-10-CM | POA: Diagnosis not present

## 2023-02-25 DIAGNOSIS — R1084 Generalized abdominal pain: Secondary | ICD-10-CM | POA: Diagnosis not present

## 2023-06-01 DIAGNOSIS — Z6841 Body Mass Index (BMI) 40.0 and over, adult: Secondary | ICD-10-CM | POA: Diagnosis not present

## 2023-06-01 DIAGNOSIS — Z0000 Encounter for general adult medical examination without abnormal findings: Secondary | ICD-10-CM | POA: Diagnosis not present

## 2023-09-07 DIAGNOSIS — I10 Essential (primary) hypertension: Secondary | ICD-10-CM | POA: Diagnosis not present

## 2023-09-07 DIAGNOSIS — K760 Fatty (change of) liver, not elsewhere classified: Secondary | ICD-10-CM | POA: Diagnosis not present

## 2023-09-07 DIAGNOSIS — E7849 Other hyperlipidemia: Secondary | ICD-10-CM | POA: Diagnosis not present

## 2023-09-07 DIAGNOSIS — M5431 Sciatica, right side: Secondary | ICD-10-CM | POA: Diagnosis not present

## 2023-10-19 DIAGNOSIS — K760 Fatty (change of) liver, not elsewhere classified: Secondary | ICD-10-CM | POA: Diagnosis not present

## 2023-12-14 DIAGNOSIS — I10 Essential (primary) hypertension: Secondary | ICD-10-CM | POA: Diagnosis not present

## 2023-12-14 DIAGNOSIS — E7849 Other hyperlipidemia: Secondary | ICD-10-CM | POA: Diagnosis not present

## 2023-12-14 DIAGNOSIS — K760 Fatty (change of) liver, not elsewhere classified: Secondary | ICD-10-CM | POA: Diagnosis not present

## 2023-12-14 DIAGNOSIS — M26602 Left temporomandibular joint disorder, unspecified: Secondary | ICD-10-CM | POA: Diagnosis not present

## 2023-12-14 DIAGNOSIS — G4730 Sleep apnea, unspecified: Secondary | ICD-10-CM | POA: Diagnosis not present

## 2023-12-14 DIAGNOSIS — M5431 Sciatica, right side: Secondary | ICD-10-CM | POA: Diagnosis not present
